# Patient Record
Sex: Female | Born: 1965 | Race: White | Hispanic: No | Marital: Married | Smoking: Never smoker
Health system: Southern US, Community
[De-identification: ages and names within clinical notes are randomized; demographics above are authoritative.]

## PROBLEM LIST (undated history)

## (undated) DIAGNOSIS — I10 Essential (primary) hypertension: Secondary | ICD-10-CM

## (undated) HISTORY — PX: ABDOMINAL HYSTERECTOMY: SHX81

## (undated) HISTORY — PX: CHOLECYSTECTOMY: SHX55

## (undated) HISTORY — DX: Essential (primary) hypertension: I10

## (undated) HISTORY — PX: BREAST REDUCTION SURGERY: SHX8

---

## 1997-09-02 ENCOUNTER — Other Ambulatory Visit: Admission: RE | Admit: 1997-09-02 | Discharge: 1997-09-02 | Payer: Self-pay | Admitting: Gynecology

## 2005-05-30 ENCOUNTER — Ambulatory Visit (HOSPITAL_BASED_OUTPATIENT_CLINIC_OR_DEPARTMENT_OTHER): Admission: RE | Admit: 2005-05-30 | Discharge: 2005-05-31 | Payer: Self-pay | Admitting: Specialist

## 2012-10-29 ENCOUNTER — Other Ambulatory Visit: Payer: Self-pay | Admitting: Gynecology

## 2014-03-13 HISTORY — PX: COLONOSCOPY: SHX174

## 2016-12-06 ENCOUNTER — Encounter: Payer: Self-pay | Admitting: Cardiology

## 2016-12-06 ENCOUNTER — Ambulatory Visit (INDEPENDENT_AMBULATORY_CARE_PROVIDER_SITE_OTHER): Payer: Commercial Managed Care - PPO | Admitting: Cardiology

## 2016-12-06 DIAGNOSIS — R9431 Abnormal electrocardiogram [ECG] [EKG]: Secondary | ICD-10-CM

## 2016-12-06 DIAGNOSIS — R0609 Other forms of dyspnea: Secondary | ICD-10-CM | POA: Diagnosis not present

## 2016-12-06 DIAGNOSIS — I10 Essential (primary) hypertension: Secondary | ICD-10-CM | POA: Diagnosis not present

## 2016-12-06 DIAGNOSIS — R002 Palpitations: Secondary | ICD-10-CM | POA: Diagnosis not present

## 2016-12-06 NOTE — Progress Notes (Signed)
Cardiology Consultation:    Date:  12/06/2016   ID:  Allison Owens, DOB 06/08/1965, MRN [id number]  PCP:  Allison Owens, Allison Popoca B, MD  Cardiologist:  Allison Balsamobert Demika Langenderfer, MD   Referring MD: Allison Owens, Allison Kloepfer B, MD   Chief Complaint  Patient presents with  . T wave inversion on EKG  . History of Tachycardia  I have abnormal EKG  History of Present Illness:    Allison Owens is a 51 y.o. female who is being seen today for the evaluation of abnormal EKG at the request of Allison Owens, [place] Virrueta B, MD.  She comes to me with a history of abnormal EKG.  Apparently when she went to her primary care physician to talk about potentially taking medication to help her lose weight.  Echocardiogram was done and some findings were noted.  She call us and get appointment to talk about that.  Overall she seems to be doing quite well she goes to the gym on a regular basis about 3-4 times a week she goes on the treadmill for about half an hour she can go at speed of about 4 mph and 2% of incline.  She gets short of breath and fatigue during the time but generally does not have any other complaint.  About a year ago she had situation that she was having some palpitations she described to have some skipped beats as well as some what appears to be more sustained arrhythmia lasting for about a month related to exercise self terminating there is no chest pain tightness squeezing pressure burning in the chest associated with this sensation.  He does have apparently good cholesterol but I will call primary care physician to get a copy of the report.  She never smoked.    Past Medical History:  Diagnosis Date  . Hypertension     Past Surgical History:  Procedure Laterality Date  . ABDOMINAL HYSTERECTOMY    . BREAST REDUCTION SURGERY    . CHOLECYSTECTOMY      Current Medications: Current Meds  Medication Sig  . propranolol ER (INDERAL LA) 160 MG SR capsule Take 1 capsule by mouth daily.  . valsartan-hydrochlorothiazide  (DIOVAN-HCT) 80-12.5 MG tablet Take 1 tablet by mouth daily. as directed     Allergies:   Patient has no known allergies.   Social History   Social History  . Marital status: Married    Spouse name: N/A  . Number of children: N/A  . Years of education: N/A   Social History Main Topics  . Smoking status: Never Smoker  . Smokeless tobacco: Never Used  . Alcohol use No  . Drug use: No  . Sexual activity: Not Asked   Other Topics Concern  . None   Social History Narrative  . None     Family History: The patient's family history includes Bladder Cancer in her father; Breast cancer in her mother; Kidney cancer in her father. ROS:   Please see the history of present illness.    All 14 point review of systems negative except as described per history of present illness.  EKGs/Labs/Other Studies Reviewed:    The following studies were reviewed today: EKG from her primary care office showing normal sinus rhythm, normal P interval, nonspecific ST-T segment changes with asymmetrical T inversion in inferior leads as well as biphasic T wave in V3 V4.  EKG:  EKG is  ordered today.  The ekg ordered today demonstrates normal sinus rhythm normal PR interval inverted T wave  in inferior leads is upright now.  There is some inverted T waves in lead V3  Recent Labs: No results found for requested labs within last 8760 hours.  Recent Lipid Panel No results found for: CHOL, TRIG, HDL, CHOLHDL, VLDL, LDLCALC, LDLDIRECT  Physical Exam:    VS:  BP 124/72   Pulse 68   Resp 10   Ht 5' 3.5" (1.613 m)   Wt 156 lb (70.8 kg)   BMI 27.20 kg/m     Wt Readings from Last 3 Encounters:  12/06/16 156 lb (70.8 kg)     GEN:  Well nourished, well developed in no acute distress HEENT: Normal NECK: No JVD; No carotid bruits LYMPHATICS: No lymphadenopathy CARDIAC: RRR, no murmurs, no rubs, no gallops RESPIRATORY:  Clear to auscultation without rales, wheezing or rhonchi  ABDOMEN: Soft, non-tender,  non-distended MUSCULOSKELETAL:  No edema; No deformity  SKIN: Warm and dry NEUROLOGIC:  Alert and oriented x 3 PSYCHIATRIC:  Normal affect   ASSESSMENT:    1. Abnormal EKG   2. Essential hypertension   3. Palpitations   4. Dyspnea on exertion    PLAN:    In order of problems listed above:  Abnormal EKG: Repeated EKG done today in the office looks better.  I will ask her to have echocardiogram to make sure structurally her heart is normal.  She will be also scheduled to have a stress echocardiogram to make sure there is no inducible ischemia.  I asked her to start taking one baby aspirin every single day until we do stress testing. Essential hypertension: Blood pressure well controlled continue present management. Palpitations: The rare we will watch this for now in the future she may require a monitor. Dyspnea on exertion: We will do echocardiogram and stress test to assess LV and potential ischemia.  Overall I have not rather low level of suspicion significant heart problems however echocardiogram and stress test need to be done.  In the future palpitations may need to be investigated more thoroughly.  Medication Adjustments/Labs and Tests Ordered: Current medicines are reviewed at length with the patient today.  Concerns regarding medicines are outlined above.  No orders of the defined types were placed in this encounter.  No orders of the defined types were placed in this encounter.   Signed, Georgeanna Lea, MD, [facility]. 12/06/2016 9:38 AM    [facility]

## 2016-12-06 NOTE — Patient Instructions (Addendum)
Medication Instructions:  Your physician recommends that you continue on your current medications as directed. Please refer to the Current Medication list given to you today.  1. Avoid all over-the-counter antihistamines except Claritin/Loratadine and Zyrtec/Cetrizine. 2. Avoid all combination including cold sinus allergies flu decongestant and sleep medications 3. You can use Robitussin DM Mucinex and Mucinex DM for cough. 4. can use Tylenol aspirin ibuprofen and naproxen but no combinations such as sleep or sinus.  Labwork: None   Testing/Procedures: EKG today in office.   Your physician has requested that you have an echocardiogram. Echocardiography is a painless test that uses sound waves to create images of your heart. It provides your doctor with information about the size and shape of your heart and how well your heart's chambers and valves are working. This procedure takes approximately one hour. There are no restrictions for this procedure.  Your physician has requested that you have a stress echocardiogram. For further information please visit [email or web address]. Please follow instruction sheet as given.  Stress [facility] Directions for [facility]: 1.) Please check in at the outpatient center at [facility] the day of your testing. 2.) Nothing to eat or drink after midnight prior to testing. Please do not take your medications that morning  3.) Please be aware that the test can take up to 3-4 hours.  4.) Should you have any problem with the appointment date or time, please call [phone].  (Please note that an IV may be required for some test.)  Follow-Up: Your physician recommends that you schedule a follow-up appointment in: 1 month   Any Other Special Instructions Will Be Listed Below (If Applicable).  Please note that any paperwork needing to be filled out by the provider will need to be addressed at the front desk prior to seeing the provider. Please note that  any paperwork FMLA, Disability or other documents regarding health condition is subject to a $25.00 charge that must be received prior to completion of paperwork in the form of a money order or check.    If you need a refill on your cardiac medications before your next appointment, please call your pharmacy.

## 2016-12-15 DIAGNOSIS — R0602 Shortness of breath: Secondary | ICD-10-CM | POA: Diagnosis not present

## 2016-12-15 DIAGNOSIS — R9431 Abnormal electrocardiogram [ECG] [EKG]: Secondary | ICD-10-CM | POA: Diagnosis not present

## 2016-12-16 ENCOUNTER — Telehealth: Payer: Self-pay

## 2016-12-16 ENCOUNTER — Other Ambulatory Visit: Payer: Self-pay

## 2016-12-16 DIAGNOSIS — R0609 Other forms of dyspnea: Secondary | ICD-10-CM

## 2016-12-16 DIAGNOSIS — R002 Palpitations: Secondary | ICD-10-CM

## 2016-12-16 DIAGNOSIS — R9431 Abnormal electrocardiogram [ECG] [EKG]: Secondary | ICD-10-CM

## 2016-12-16 NOTE — Telephone Encounter (Signed)
Left message regarding results for pt of stress echo and echo.

## 2018-03-20 ENCOUNTER — Other Ambulatory Visit: Payer: Self-pay | Admitting: Obstetrics and Gynecology

## 2018-03-20 DIAGNOSIS — Z9189 Other specified personal risk factors, not elsewhere classified: Secondary | ICD-10-CM

## 2018-04-09 ENCOUNTER — Other Ambulatory Visit: Payer: Commercial Managed Care - PPO

## 2018-08-13 ENCOUNTER — Emergency Department (HOSPITAL_COMMUNITY): Payer: Commercial Managed Care - PPO

## 2018-08-13 ENCOUNTER — Emergency Department (HOSPITAL_COMMUNITY)
Admission: EM | Admit: 2018-08-13 | Discharge: 2018-08-13 | Disposition: A | Discharge status: Home / Self Care | Payer: Commercial Managed Care - PPO | Attending: Emergency Medicine | Admitting: Emergency Medicine

## 2018-08-13 ENCOUNTER — Other Ambulatory Visit: Payer: Self-pay

## 2018-08-13 ENCOUNTER — Encounter: Payer: Self-pay | Admitting: General Surgery

## 2018-08-13 DIAGNOSIS — R0602 Shortness of breath: Secondary | ICD-10-CM | POA: Diagnosis not present

## 2018-08-13 DIAGNOSIS — I10 Essential (primary) hypertension: Secondary | ICD-10-CM | POA: Insufficient documentation

## 2018-08-13 DIAGNOSIS — Z79899 Other long term (current) drug therapy: Secondary | ICD-10-CM | POA: Insufficient documentation

## 2018-08-13 DIAGNOSIS — R05 Cough: Secondary | ICD-10-CM | POA: Diagnosis not present

## 2018-08-13 DIAGNOSIS — Z9889 Other specified postprocedural states: Secondary | ICD-10-CM | POA: Insufficient documentation

## 2018-08-13 DIAGNOSIS — R112 Nausea with vomiting, unspecified: Secondary | ICD-10-CM | POA: Insufficient documentation

## 2018-08-13 DIAGNOSIS — Z20828 Contact with and (suspected) exposure to other viral communicable diseases: Secondary | ICD-10-CM | POA: Insufficient documentation

## 2018-08-13 DIAGNOSIS — R509 Fever, unspecified: Secondary | ICD-10-CM | POA: Insufficient documentation

## 2018-08-13 LAB — URINALYSIS, ROUTINE W REFLEX MICROSCOPIC
Bacteria, UA: NONE SEEN
Bilirubin Urine: NEGATIVE
Glucose, UA: NEGATIVE mg/dL
Ketones, ur: NEGATIVE mg/dL
Leukocytes,Ua: NEGATIVE
Nitrite: NEGATIVE
Protein, ur: NEGATIVE mg/dL
Specific Gravity, Urine: 1.01 (ref 1.005–1.030)
pH: 5 (ref 5.0–8.0)

## 2018-08-13 LAB — COMPREHENSIVE METABOLIC PANEL
ALT: 12 U/L (ref 0–44)
AST: 22 U/L (ref 15–41)
Albumin: 2.9 g/dL — ABNORMAL LOW (ref 3.5–5.0)
Alkaline Phosphatase: 43 U/L (ref 38–126)
Anion gap: 8 (ref 5–15)
BUN: 10 mg/dL (ref 6–20)
CO2: 24 mmol/L (ref 22–32)
Calcium: 8.5 mg/dL — ABNORMAL LOW (ref 8.9–10.3)
Chloride: 103 mmol/L (ref 98–111)
Creatinine, Ser: 1.02 mg/dL — ABNORMAL HIGH (ref 0.44–1.00)
GFR calc Af Amer: >60 mL/min (ref >60)
GFR calc non Af Amer: >60 mL/min (ref >60)
Glucose, Bld: 104 mg/dL — ABNORMAL HIGH (ref 70–99)
Potassium: 3.9 mmol/L (ref 3.5–5.1)
Sodium: 135 mmol/L (ref 135–145)
Total Bilirubin: 1 mg/dL (ref 0.3–1.2)
Total Protein: 6.4 g/dL — ABNORMAL LOW (ref 6.5–8.1)

## 2018-08-13 LAB — CBC WITH DIFFERENTIAL/PLATELET
Abs Immature Granulocytes: 0.03 10*3/uL (ref 0.00–0.07)
Basophils Absolute: 0.1 10*3/uL (ref 0.0–0.1)
Basophils Relative: 1 %
Eosinophils Absolute: 0.6 10*3/uL — ABNORMAL HIGH (ref 0.0–0.5)
Eosinophils Relative: 6 %
HCT: 33.8 % — ABNORMAL LOW (ref 36.0–46.0)
Hemoglobin: 11.1 g/dL — ABNORMAL LOW (ref 12.0–15.0)
Immature Granulocytes: 0 %
Lymphocytes Relative: 16 %
Lymphs Abs: 1.6 10*3/uL (ref 0.7–4.0)
MCH: 31 pg (ref 26.0–34.0)
MCHC: 32.8 g/dL (ref 30.0–36.0)
MCV: 94.4 fL (ref 80.0–100.0)
Monocytes Absolute: 0.8 10*3/uL (ref 0.1–1.0)
Monocytes Relative: 8 %
Neutro Abs: 6.9 10*3/uL (ref 1.7–7.7)
Neutrophils Relative %: 69 %
Platelets: 288 10*3/uL (ref 150–400)
RBC: 3.58 MIL/uL — ABNORMAL LOW (ref 3.87–5.11)
RDW: 13.4 % (ref 11.5–15.5)
WBC: 10 10*3/uL (ref 4.0–10.5)
nRBC: 0 % (ref 0.0–0.2)

## 2018-08-13 LAB — LACTIC ACID, PLASMA: Lactic Acid, Venous: 1.2 mmol/L (ref 0.5–1.9)

## 2018-08-13 LAB — SARS CORONAVIRUS 2 BY RT PCR (HOSPITAL ORDER, PERFORMED IN ~~LOC~~ HOSPITAL LAB): SARS Coronavirus 2: NEGATIVE

## 2018-08-13 MED ORDER — SODIUM CHLORIDE 0.9 % IV SOLN
1000.0000 mL | INTRAVENOUS | Status: DC
  Administered 2018-08-13: 1000 mL via INTRAVENOUS

## 2018-08-13 MED ORDER — VANCOMYCIN HCL IN DEXTROSE 1-5 GM/200ML-% IV SOLN: 1000.0000 mg | Freq: Once | INTRAVENOUS | Status: DC

## 2018-08-13 MED ORDER — ACETAMINOPHEN 325 MG PO TABS
650.0000 mg | ORAL_TABLET | Freq: Once | ORAL | Status: AC
  Administered 2018-08-13: 18:00:00 650 mg via ORAL
  Filled 2018-08-13: qty 2

## 2018-08-13 MED ORDER — SODIUM CHLORIDE 0.9 % IV BOLUS
1000.0000 mL | Freq: Once | INTRAVENOUS | Status: AC
  Administered 2018-08-13: 1000 mL via INTRAVENOUS

## 2018-08-13 MED ORDER — IOHEXOL 350 MG/ML SOLN
100.0000 mL | Freq: Once | INTRAVENOUS | Status: AC | PRN
  Administered 2018-08-13: 14:00:00 100 mL via INTRAVENOUS

## 2018-08-13 MED ORDER — ONDANSETRON HCL 4 MG/2ML IJ SOLN
4.0000 mg | Freq: Once | INTRAMUSCULAR | Status: AC
  Administered 2018-08-13: 4 mg via INTRAVENOUS
  Filled 2018-08-13: qty 2

## 2018-08-13 MED ORDER — SODIUM CHLORIDE 0.9 % IV SOLN: 2.0000 g | Freq: Once | INTRAVENOUS | Status: DC

## 2018-08-13 MED ORDER — SODIUM CHLORIDE 0.9 % IV BOLUS (SEPSIS)
1000.0000 mL | Freq: Once | INTRAVENOUS | Status: AC
  Administered 2018-08-13: 1000 mL via INTRAVENOUS

## 2018-08-13 NOTE — Discharge Instructions (Addendum)
You have been seen today for fever. Please read and follow all provided instructions. Return to the emergency room for worsening condition or new concerning symptoms.    1. Medications:  Continue the antibiotic you are prescribed by Dr. Lenon Curt.  Please take as prescribed. You can continue taking Tylenol as needed for fever. Please take as directed. Continue usual home medications Take medications as prescribed. Please review all of the medicines and only take them if you do not have an allergy to them.   2. Treatment: rest, drink plenty of fluids 3. Follow Up: Please follow up with Dr. Lenon Curt tomorrow at your scheduled appointment.  It is also a possibility that you have an allergic reaction to any of the medicines that you have been prescribed - Everybody reacts differently to medications and while MOST people have no trouble with most medicines, you may have a reaction such as nausea, vomiting, rash, swelling, shortness of breath. If this is the case, please stop taking the medicine immediately and contact your physician.  ?

## 2018-08-13 NOTE — ED Provider Notes (Signed)
[facility]   CSN: [id number] Arrival date & time: 08/13/18  1134    History   Chief Complaint Chief Complaint  Patient presents with   Fever    HPI Allison Owens is a 53 y.o. female has medical history of hypertension presents emergency department today with chief complaint of fever x3 days.  Patient states 10 days ago she had abdominoplasty with Dr. Izora Ribasoley.  Her postop has been uneventful, until this weekend when she developed fever.  T-max of 102.  She has been taking Tylenol every 5 hours.  Last dose was 1 hour prior to arrival.  Patient states she also has shortness of breath and non productive cough.  Also reports nausea and 2 episodes of nonbloody and nonbilious emesis in the last 24 hours. Pt called Dr. Debby Budoley's office x 2 days ago and was prescribed Keflex for possible infection. She followed up in office today and was sent to the ED for further evaluation. Denies headache, neck pain, chest pain, exogenous estrogen use, lower extremity pain or swelling, history of PE or DVT, family or personal history of bleeding or clotting disorders, or hemoptysis.  Also denies any sick contacts, any exposure to someone positive with COVID-19.  History provided by patient with additional history obtained from chart review.      Past Medical History:  Diagnosis Date   Hypertension     Patient Active Problem List   Diagnosis Date Noted   Abnormal EKG 12/06/2016   Essential hypertension 12/06/2016   Palpitations 12/06/2016   Dyspnea on exertion 12/06/2016    Past Surgical History:  Procedure Laterality Date   ABDOMINAL HYSTERECTOMY     BREAST REDUCTION SURGERY     CHOLECYSTECTOMY       OB History   No obstetric history on file.      Home Medications    Prior to Admission medications   Medication Sig Start Date End Date Taking? Authorizing Provider  propranolol ER (INDERAL LA) 160 MG SR capsule Take 1 capsule by  mouth daily. 03/10/15   [provider]  valsartan-hydrochlorothiazide (DIOVAN-HCT) 80-12.5 MG tablet Take 1 tablet by mouth daily. as directed 11/02/16   [provider]    Family History Family History  Problem Relation Age of Onset   Breast cancer Mother    Bladder Cancer Father    Kidney cancer Father     Social History Social History   Tobacco Use   Smoking status: Never Smoker   Smokeless tobacco: Never Used  Substance Use Topics   Alcohol use: No   Drug use: No     Allergies   Patient has no known allergies.   Review of Systems Review of Systems  Constitutional: Positive for fever. Negative for chills.  HENT: Negative for congestion, ear discharge, ear pain, sinus pressure, sinus pain and sore throat.   Eyes: Negative for pain and redness.  Respiratory: Positive for cough and shortness of breath.   Cardiovascular: Negative for chest pain, palpitations and leg swelling.  Gastrointestinal: Positive for nausea and vomiting. Negative for abdominal pain, constipation and diarrhea.  Genitourinary: Negative for dysuria and hematuria.  Musculoskeletal: Negative for back pain and neck pain.  Skin: Negative for wound.  Neurological: Negative for weakness, numbness and headaches.     Physical Exam Updated Vital Signs BP 105/69    Pulse 86    Temp 99.3 F (37.4 C) (Oral) Comment: took tylenol 11am today   Resp 16  Ht 5\' 3"  (1.6 m)    Wt 68.9 kg    SpO2 97%    BMI 26.93 kg/m   Physical Exam Vitals signs and nursing note reviewed.  Constitutional:      General: She is not in acute distress.    Appearance: She is not ill-appearing.  HENT:     Head: Normocephalic and atraumatic.     Right Ear: Tympanic membrane and external ear normal.     Left Ear: Tympanic membrane and external ear normal.     Nose: Nose normal.     Mouth/Throat:     Mouth: Mucous membranes are moist.     Pharynx: Oropharynx is clear.  Eyes:     General: No scleral  icterus.       Right eye: No discharge.        Left eye: No discharge.     Extraocular Movements: Extraocular movements intact.     Conjunctiva/sclera: Conjunctivae normal.     Pupils: Pupils are equal, round, and reactive to light.  Neck:     Musculoskeletal: Normal range of motion.     Vascular: No JVD.  Cardiovascular:     Rate and Rhythm: Normal rate and regular rhythm.     Pulses: Normal pulses.          Radial pulses are 2+ on the right side and 2+ on the left side.     Heart sounds: Normal heart sounds.  Pulmonary:     Comments: Lungs clear to auscultation in all fields. Symmetric chest rise. No wheezing, rales, or rhonchi. Chest:     Chest wall: No tenderness.  Abdominal:       Comments: Abdomen is soft, non-distended. Pt with abdominal incision. No drainage or surrounding erythema noted, no dehiscence. Appropriately tender to palpation over incision.  Musculoskeletal: Normal range of motion.     Comments: Homans sign absent bilaterally, no lower extremity edema, no palpable cords, compartments are soft.  Skin:    General: Skin is warm and dry.     Capillary Refill: Capillary refill takes less than 2 seconds.  Neurological:     Mental Status: She is oriented to person, place, and time.     GCS: GCS eye subscore is 4. GCS verbal subscore is 5. GCS motor subscore is 6.     Comments: Fluent speech, no facial droop.  Psychiatric:        Behavior: Behavior normal.      ED Treatments / Results  Labs (all labs ordered are listed, but only abnormal results are displayed) Labs Reviewed  COMPREHENSIVE METABOLIC PANEL - Abnormal; Notable for the following components:      Result Value   Glucose, Bld 104 (*)    Creatinine, Ser 1.02 (*)    Calcium 8.5 (*)    Total Protein 6.4 (*)    Albumin 2.9 (*)    All other components within normal limits  CBC WITH DIFFERENTIAL/PLATELET - Abnormal; Notable for the following components:   RBC 3.58 (*)    Hemoglobin 11.1 (*)    HCT  33.8 (*)    Eosinophils Absolute 0.6 (*)    All other components within normal limits  URINALYSIS, ROUTINE W REFLEX MICROSCOPIC - Abnormal; Notable for the following components:   APPearance HAZY (*)    Hgb urine dipstick SMALL (*)    All other components within normal limits  SARS CORONAVIRUS 2 (HOSPITAL ORDER, [facility])  CULTURE, BLOOD (ROUTINE X 2)  CULTURE, BLOOD (ROUTINE X 2)  GASTROINTESTINAL PANEL BY PCR, STOOL (REPLACES STOOL CULTURE)  LACTIC ACID, PLASMA    EKG None  Radiology Ct Angio Chest Pe W And/or Wo Contrast  Result Date: 08/13/2018 CLINICAL DATA:  53 year old female with shortness of breath. Recent tummy tuck surgery. Patient also has fever and chills and cough and vomiting. EXAM: CT ANGIOGRAPHY CHEST CT ABDOMEN AND PELVIS WITH CONTRAST TECHNIQUE: Multidetector CT imaging of the chest was performed using the standard protocol during bolus administration of intravenous contrast. Multiplanar CT image reconstructions and MIPs were obtained to evaluate the vascular anatomy. Multidetector CT imaging of the abdomen and pelvis was performed using the standard protocol during bolus administration of intravenous contrast. CONTRAST:  100mL OMNIPAQUE IOHEXOL 350 MG/ML SOLN COMPARISON:  CT of the abdomen pelvis dated 11/19/2009 FINDINGS: CTA CHEST FINDINGS Cardiovascular: There is no cardiomegaly or pericardial effusion. The thoracic aorta is unremarkable. The origins of the great vessels of the aortic arch appear patent as visualized. There is no CT evidence of pulmonary embolism. Mediastinum/Nodes: No hilar or mediastinal adenopathy. The esophagus and the thyroid gland are grossly unremarkable. No mediastinal fluid collection. Lungs/Pleura: The lungs are clear. There is no pleural effusion or pneumothorax. The central airways are patent. Musculoskeletal: No chest wall abnormality. No acute or significant osseous findings. Review of the MIP images confirms the  above findings. CT ABDOMEN and PELVIS FINDINGS No intra-abdominal free air or free fluid. Hepatobiliary: Cholecystectomy. Probable mild fatty infiltration of the liver. No intrahepatic biliary ductal dilatation. Pancreas: Unremarkable. No pancreatic ductal dilatation or surrounding inflammatory changes. Spleen: Normal in size without focal abnormality. Adrenals/Urinary Tract: Adrenal glands are unremarkable. Kidneys are normal, without renal calculi, focal lesion, or hydronephrosis. Bladder is unremarkable. Stomach/Bowel: There is no bowel obstruction or active inflammation. Small scattered sigmoid diverticula without active inflammation. The appendix is normal. Vascular/Lymphatic: The abdominal aorta and IVC are unremarkable. No portal venous gas. There is no adenopathy. Reproductive: Hysterectomy. No pelvic mass. Other: Diffuse subcutaneous edema of the lower abdomen and pelvis in keeping with recent history of liposuction. There is a 10.4 x 2.2 x 7.8 cm fluid collection with small pockets of air superficial to the rectus muscle in the subcutaneous fat extending from the level of the umbilicus inferiorly. This may represent postsurgical fluid collection or seroma although superimposed infection is not entirely excluded. Clinical correlation is recommended. Musculoskeletal: Degenerative changes of the lower lumbar spine. No acute osseous pathology. Review of the MIP images confirms the above findings. IMPRESSION: 1. No acute intrathoracic pathology. No CT evidence of pulmonary embolism or aortic dissection. 2. Postsurgical changes of liposuction. A fluid collection with small pockets of air superficial to the rectus muscle in the subcutaneous fat in the lower abdomen/pelvis may represent postsurgical fluid collection or seroma or possibly an infected fluid. Clinical correlation is recommended. Electronically Signed   By: Elgie CollardArash  Radparvar M.D.   On: 08/13/2018 14:48   Ct Abdomen Pelvis W Contrast  Result Date:  08/13/2018 CLINICAL DATA:  53 year old female with shortness of breath. Recent tummy tuck surgery. Patient also has fever and chills and cough and vomiting. EXAM: CT ANGIOGRAPHY CHEST CT ABDOMEN AND PELVIS WITH CONTRAST TECHNIQUE: Multidetector CT imaging of the chest was performed using the standard protocol during bolus administration of intravenous contrast. Multiplanar CT image reconstructions and MIPs were obtained to evaluate the vascular anatomy. Multidetector CT imaging of the abdomen and pelvis was performed using the standard protocol during bolus administration of intravenous contrast. CONTRAST:  100mL OMNIPAQUE  IOHEXOL 350 MG/ML SOLN COMPARISON:  CT of the abdomen pelvis dated 11/19/2009 FINDINGS: CTA CHEST FINDINGS Cardiovascular: There is no cardiomegaly or pericardial effusion. The thoracic aorta is unremarkable. The origins of the great vessels of the aortic arch appear patent as visualized. There is no CT evidence of pulmonary embolism. Mediastinum/Nodes: No hilar or mediastinal adenopathy. The esophagus and the thyroid gland are grossly unremarkable. No mediastinal fluid collection. Lungs/Pleura: The lungs are clear. There is no pleural effusion or pneumothorax. The central airways are patent. Musculoskeletal: No chest wall abnormality. No acute or significant osseous findings. Review of the MIP images confirms the above findings. CT ABDOMEN and PELVIS FINDINGS No intra-abdominal free air or free fluid. Hepatobiliary: Cholecystectomy. Probable mild fatty infiltration of the liver. No intrahepatic biliary ductal dilatation. Pancreas: Unremarkable. No pancreatic ductal dilatation or surrounding inflammatory changes. Spleen: Normal in size without focal abnormality. Adrenals/Urinary Tract: Adrenal glands are unremarkable. Kidneys are normal, without renal calculi, focal lesion, or hydronephrosis. Bladder is unremarkable. Stomach/Bowel: There is no bowel obstruction or active inflammation. Small  scattered sigmoid diverticula without active inflammation. The appendix is normal. Vascular/Lymphatic: The abdominal aorta and IVC are unremarkable. No portal venous gas. There is no adenopathy. Reproductive: Hysterectomy. No pelvic mass. Other: Diffuse subcutaneous edema of the lower abdomen and pelvis in keeping with recent history of liposuction. There is a 10.4 x 2.2 x 7.8 cm fluid collection with small pockets of air superficial to the rectus muscle in the subcutaneous fat extending from the level of the umbilicus inferiorly. This may represent postsurgical fluid collection or seroma although superimposed infection is not entirely excluded. Clinical correlation is recommended. Musculoskeletal: Degenerative changes of the lower lumbar spine. No acute osseous pathology. Review of the MIP images confirms the above findings. IMPRESSION: 1. No acute intrathoracic pathology. No CT evidence of pulmonary embolism or aortic dissection. 2. Postsurgical changes of liposuction. A fluid collection with small pockets of air superficial to the rectus muscle in the subcutaneous fat in the lower abdomen/pelvis may represent postsurgical fluid collection or seroma or possibly an infected fluid. Clinical correlation is recommended. Electronically Signed   By: Elgie Collard M.D.   On: 08/13/2018 14:48    Procedures Procedures (including critical care time)  Medications Ordered in ED Medications  sodium chloride 0.9 % bolus 1,000 mL (0 mLs Intravenous Stopped 08/13/18 1440)  ondansetron (ZOFRAN) injection 4 mg (4 mg Intravenous Given 08/13/18 1254)  sodium chloride 0.9 % bolus 1,000 mL (0 mLs Intravenous Stopped 08/13/18 1512)  iohexol (OMNIPAQUE) 350 MG/ML injection 100 mL (100 mLs Intravenous Contrast Given 08/13/18 1416)  ondansetron (ZOFRAN) injection 4 mg (4 mg Intravenous Given 08/13/18 1552)  acetaminophen (TYLENOL) tablet 650 mg (650 mg Oral Given 08/13/18 1806)     Initial Impression / Assessment and Plan / ED  Course  I have reviewed the triage vital signs and the nursing notes.  Pertinent labs & imaging results that were available during my care of the patient were reviewed by me and considered in my medical decision making (see chart for details).  53 year old female presents with fever 10 days s/p abdominoplasty with Dr. Izora Ribas.  On arrival low-grade fever of 99.3, normotensive, not tachycardic, not hypoxic.  She is overall well-appearing.  Tenderness palpation of abdomen over surgical incision.  Incision is clean dry and intact no obvious signs of infection.  Negative Homans sign bilaterally.  Work-up initiated with CBC, CMP, lactic acid, UA, blood cultures.  DDX includes intraabdominal infection, PE, URI, UTI, pneumonia, covid. Plan  to obtain CTA chest and CT abdomen pelvis imaging for further evaluation.  IV fluids started and Zofran for nausea.  Will reassess.  CBC and CMP overall unremarkable. Lactic acid within normal range. Blood cultures pending. CTA chest negative for PE. CT A/P shows 10.4 x 2.2 x 7.8 cm fluid collection with small pockets of air superficial to the rectus muscle in the subcutaneous fat. Radiologist reports possible postsurgical fluid collection or seroma.  CT findings discussed with Dr. Izora Ribasoley.  He is recommending continuing p.o. antibiotics and discharge home to follow-up in office tomorrow for possible drainage of abdominal fluid collection. Recheck temperature 100.9, tylenol given. Pt is tolerating PO intake. I ambulated pt to restroom multiple times an she did so without difficulty, steady gait, no dyspnea. Covid test is negative.  Patient is hemodynamically stable, in NAD, and able to ambulate in the ED. Evaluation does not show pathology that would require ongoing emergent intervention or inpatient treatment. I explained the diagnosis to the patient.  Patient and her daughter are comfortable with above plan and pt is stable for discharge at this time. All questions were  answered prior to disposition.  Strict return precautions for returning to the ED were discussed. Encouraged follow up with Dr. Izora Ribasoley with scheduled appointment tomorrow at 1pm. Findings and plan of care discussed with supervising physician Dr. Broadus JohnPfieffer.   This note was prepared using Dragon voice recognition software and may include unintentional dictation errors due to the inherent limitations of voice recognition software.     Final Clinical Impressions(s) / ED Diagnoses   Final diagnoses:  Fever, unspecified fever cause    ED Discharge Orders    None       Sherene Sireslbrizze, Terryl Niziolek E, PA-C 08/14/18 0128    Arby BarrettePfeiffer, Marcy, MD 08/14/18 1123

## 2018-08-13 NOTE — ED Notes (Signed)
Patient verbalizes understanding of discharge instructions. Opportunity for questioning and answers were provided. Armband removed by staff, pt discharged from ED.  

## 2018-08-13 NOTE — ED Triage Notes (Signed)
Patient post op 10 days ago, got drain pulled Friday and started having fever 102 at home, chills, slight dry cough, vomiting and dry heaves.

## 2018-08-18 LAB — CULTURE, BLOOD (ROUTINE X 2)
Culture: NO GROWTH
Culture: NO GROWTH
Special Requests: ADEQUATE

## 2019-11-04 ENCOUNTER — Other Ambulatory Visit: Payer: Self-pay

## 2019-11-04 ENCOUNTER — Encounter (HOSPITAL_BASED_OUTPATIENT_CLINIC_OR_DEPARTMENT_OTHER): Payer: Self-pay | Admitting: *Deleted

## 2019-11-04 ENCOUNTER — Emergency Department (HOSPITAL_BASED_OUTPATIENT_CLINIC_OR_DEPARTMENT_OTHER)
Admission: EM | Admit: 2019-11-04 | Discharge: 2019-11-04 | Disposition: A | Discharge status: Home / Self Care | Payer: Commercial Managed Care - PPO | Attending: Emergency Medicine | Admitting: Emergency Medicine

## 2019-11-04 DIAGNOSIS — Z5321 Procedure and treatment not carried out due to patient leaving prior to being seen by health care provider: Secondary | ICD-10-CM | POA: Insufficient documentation

## 2019-11-04 DIAGNOSIS — S91352A Open bite, left foot, initial encounter: Secondary | ICD-10-CM | POA: Diagnosis present

## 2019-11-04 DIAGNOSIS — W5911XA Bitten by nonvenomous snake, initial encounter: Secondary | ICD-10-CM | POA: Insufficient documentation

## 2019-11-04 NOTE — ED Triage Notes (Addendum)
Pt c/o left foot snake bite x 3 days ago seen at  Ed for same  , sent here by PMD for [place] d/t increase swelling

## 2019-11-04 NOTE — ED Notes (Signed)
Strong pulses to right foot by bedside doppler

## 2019-11-05 ENCOUNTER — Emergency Department (HOSPITAL_COMMUNITY)
Admission: EM | Admit: 2019-11-05 | Discharge: 2019-11-05 | Disposition: A | Discharge status: Home / Self Care | Payer: Commercial Managed Care - PPO | Attending: Emergency Medicine | Admitting: Emergency Medicine

## 2019-11-05 ENCOUNTER — Emergency Department (HOSPITAL_BASED_OUTPATIENT_CLINIC_OR_DEPARTMENT_OTHER): Payer: Commercial Managed Care - PPO

## 2019-11-05 ENCOUNTER — Encounter (HOSPITAL_COMMUNITY): Payer: Self-pay | Admitting: Emergency Medicine

## 2019-11-05 ENCOUNTER — Emergency Department (HOSPITAL_COMMUNITY): Payer: Commercial Managed Care - PPO

## 2019-11-05 ENCOUNTER — Other Ambulatory Visit: Payer: Self-pay

## 2019-11-05 DIAGNOSIS — Z2912 Encounter for prophylactic antivenin: Secondary | ICD-10-CM | POA: Insufficient documentation

## 2019-11-05 DIAGNOSIS — Z79899 Other long term (current) drug therapy: Secondary | ICD-10-CM | POA: Diagnosis not present

## 2019-11-05 DIAGNOSIS — I10 Essential (primary) hypertension: Secondary | ICD-10-CM | POA: Diagnosis not present

## 2019-11-05 DIAGNOSIS — M79609 Pain in unspecified limb: Secondary | ICD-10-CM

## 2019-11-05 DIAGNOSIS — M7989 Other specified soft tissue disorders: Secondary | ICD-10-CM

## 2019-11-05 DIAGNOSIS — W5911XD Bitten by nonvenomous snake, subsequent encounter: Secondary | ICD-10-CM

## 2019-11-05 DIAGNOSIS — T63001D Toxic effect of unspecified snake venom, accidental (unintentional), subsequent encounter: Secondary | ICD-10-CM | POA: Diagnosis present

## 2019-11-05 LAB — CBC
HCT: 40.8 % (ref 36.0–46.0)
Hemoglobin: 13.2 g/dL (ref 12.0–15.0)
MCH: 30.1 pg (ref 26.0–34.0)
MCHC: 32.4 g/dL (ref 30.0–36.0)
MCV: 92.9 fL (ref 80.0–100.0)
Platelets: 196 10*3/uL (ref 150–400)
RBC: 4.39 MIL/uL (ref 3.87–5.11)
RDW: 13 % (ref 11.5–15.5)
WBC: 6.9 10*3/uL (ref 4.0–10.5)
nRBC: 0 % (ref 0.0–0.2)

## 2019-11-05 LAB — PROTIME-INR
INR: 1.1 (ref 0.8–1.2)
Prothrombin Time: 13.5 seconds (ref 11.4–15.2)

## 2019-11-05 LAB — COMPREHENSIVE METABOLIC PANEL
ALT: 26 U/L (ref 0–44)
AST: 33 U/L (ref 15–41)
Albumin: 3.5 g/dL (ref 3.5–5.0)
Alkaline Phosphatase: 54 U/L (ref 38–126)
Anion gap: 9 (ref 5–15)
BUN: 13 mg/dL (ref 6–20)
CO2: 25 mmol/L (ref 22–32)
Calcium: 8.9 mg/dL (ref 8.9–10.3)
Chloride: 104 mmol/L (ref 98–111)
Creatinine, Ser: 0.98 mg/dL (ref 0.44–1.00)
GFR calc Af Amer: >60 mL/min (ref >60)
GFR calc non Af Amer: >60 mL/min (ref >60)
Glucose, Bld: 100 mg/dL — ABNORMAL HIGH (ref 70–99)
Potassium: 3.5 mmol/L (ref 3.5–5.1)
Sodium: 138 mmol/L (ref 135–145)
Total Bilirubin: 1 mg/dL (ref 0.3–1.2)
Total Protein: 6.9 g/dL (ref 6.5–8.1)

## 2019-11-05 LAB — I-STAT BETA HCG BLOOD, ED (MC, WL, AP ONLY): I-stat hCG, quantitative: <5 m[IU]/mL (ref <5)

## 2019-11-05 LAB — FIBRINOGEN: Fibrinogen: 457 mg/dL (ref 210–475)

## 2019-11-05 LAB — APTT: aPTT: 31 seconds (ref 24–36)

## 2019-11-05 MED ORDER — CROTALIDAE POLYVAL IMMUNE FAB IV SOLR: 4.0000 | Freq: Once | INTRAVENOUS | Status: DC

## 2019-11-05 MED ORDER — SODIUM CHLORIDE 0.9 % IV SOLN
4.0000 | Freq: Once | INTRAVENOUS | Status: AC
  Administered 2019-11-05: 4 via INTRAVENOUS
  Filled 2019-11-05: qty 72

## 2019-11-05 NOTE — ED Triage Notes (Signed)
Patient states she was bit by a copperhead on her L foot Saturday night, pt went to [facility], was not given crofab, evaluated for a few hours then sent home. States she saw PCP yesterday who was concerned due to continued swelling and pain. Pt sent to [facility] for [place] yesterday but states when she got there they said they could not do anything for her so she left. L foot swollen, and pt states that swelling has spread up her leg. Foot is grey, unable to palpate pedal pulse.

## 2019-11-05 NOTE — ED Provider Notes (Signed)
[facility] EMERGENCY DEPARTMENT Provider Note   CSN: [id number] Arrival date & time: 11/05/19  6237     History Chief Complaint  Patient presents with  . Snake Bite    Allison Owens is a 54 y.o. female.  HPI  54 year old woman with snakebite to left foot that occurred 4 days prior to evaluation.  She was seen initially at an outlying hospital.  At that time, she did not meet CroFab criteria.  She has had ongoing pain and increased swelling in the foot.  She was seen by her primary care doctor yesterday.  She was advised to come for further evaluation.  She has noted swelling up to the groin.  She has some redness of the foot up onto the lower leg.  She has not noted any dyspnea, lightheadedness, other bleeding, nausea, or vomiting.     Past Medical History:  Diagnosis Date  . Hypertension     Patient Active Problem List   Diagnosis Date Noted  . Abnormal EKG 12/06/2016  . Essential hypertension 12/06/2016  . Palpitations 12/06/2016  . Dyspnea on exertion 12/06/2016    Past Surgical History:  Procedure Laterality Date  . ABDOMINAL HYSTERECTOMY    . BREAST REDUCTION SURGERY    . CHOLECYSTECTOMY       OB History   No obstetric history on file.     Family History  Problem Relation Age of Onset  . Breast cancer Mother   . Bladder Cancer Father   . Kidney cancer Father     Social History   Tobacco Use  . Smoking status: Never Smoker  . Smokeless tobacco: Never Used  Vaping Use  . Vaping Use: Never used  Substance Use Topics  . Alcohol use: No  . Drug use: No    Home Medications Prior to Admission medications   Medication Sig Start Date End Date Taking? Authorizing Provider  propranolol ER (INDERAL LA) 160 MG SR capsule Take 1 capsule by mouth daily. 03/10/15  Yes [provider]  rizatriptan (MAXALT) 10 MG tablet Take 10 mg by mouth as directed. 10 mg at the start of migraine   Yes [provider]    Allergies     Codeine and Versed [midazolam]  Review of Systems   Review of Systems  All other systems reviewed and are negative.   Physical Exam Updated Vital Signs BP (!) 141/83 (BP Location: Right Arm)   Pulse 74   Temp 98.1 F (36.7 C) (Oral)   Resp 17   Ht 1.6 m (5\' 3" )   Wt 70.8 kg   SpO2 97%   BMI 27.63 kg/m   Physical Exam Vitals and nursing note reviewed.  Constitutional:      Appearance: Normal appearance.  HENT:     Head: Normocephalic.     Right Ear: External ear normal.     Left Ear: External ear normal.     Nose: Nose normal.     Mouth/Throat:     Mouth: Mucous membranes are moist.  Eyes:     Pupils: Pupils are equal, round, and reactive to light.  Cardiovascular:     Rate and Rhythm: Normal rate and regular rhythm.     Pulses: Normal pulses.     Heart sounds: Normal heart sounds.  Pulmonary:     Effort: Pulmonary effort is normal.  Abdominal:     General: Bowel sounds are normal.     Palpations: Abdomen is soft.  Musculoskeletal:  Cervical back: Normal range of motion.     Comments: Left foot with 2 puncture wounds with surrounding erythema and discoloration of foot extending up to ankle.  There is swelling of the left lower extremity compared to the right lower extremity most pronounced in the lower part of the leg.  Pulses are intact.  Sensation is intact.  No cords are noted  Skin:    General: Skin is warm and dry.     Capillary Refill: Capillary refill takes less than 2 seconds.  Neurological:     General: No focal deficit present.     Mental Status: She is alert.  Psychiatric:        Mood and Affect: Mood normal.     ED Results / Procedures / Treatments   Labs (all labs ordered are listed, but only abnormal results are displayed) Labs Reviewed  CBC  PROTIME-INR  APTT  FIBRINOGEN  COMPREHENSIVE METABOLIC PANEL  I-STAT BETA HCG BLOOD, ED ([facility], [facility], [facility] ONLY)    EKG None  Radiology DG Foot 2 Views Left  Result Date: 11/05/2019 CLINICAL  DATA:  Snake bite to dorsal surface of LEFT foot on Saturday, swelling increased to mid calf, pain radiating to LEFT groin EXAM: LEFT FOOT - 2 VIEW COMPARISON:  None FINDINGS: Diffuse soft tissue swelling. Osseous mineralization normal. Joint spaces preserved. No fracture, dislocation, or bone destruction. IMPRESSION: Soft tissue swelling without osseous abnormalities. Electronically Signed   By: Ulyses Southward M.D.   On: 11/05/2019 09:57    Procedures Procedures (including critical care time)  Medications Ordered in ED Medications  crotalidae polyvalent immune fab (CROFAB) 4 vial in sodium chloride 0.9 % 250 mL infusion (has no administration in time range)    ED Course  I have reviewed the triage vital signs and the nursing notes.  Pertinent labs & imaging results that were available during my care of the patient were reviewed by me and considered in my medical decision making (see chart for details).    MDM Rules/Calculators/A&P                         Discussed tach frequency of CroFab in patient with bite 4 days ago.  There appears to be some efficacy and patient is offered CroFab therapy. Snake bite to foot 4 days ago. Labs checked and within normal limits including INR high, platelets, fibrinogen, PTT. No evidence of systemic reaction on exam Extremity is swollen does not appear to have any vascular insufficiency or signs consistent with compartment syndrome  Final Clinical Impression(s) / ED Diagnoses Final diagnoses:  Snake bite, subsequent encounter    Rx / DC Orders ED Discharge Orders    None       Margarita Grizzle, MD 11/08/19 1316

## 2019-11-05 NOTE — Discharge Instructions (Signed)
Use Ace wrap right knee so as not to cause any vascular insufficiency or pain Keep leg elevated Use pain medicine as prescribed Return for recheck if increasing swelling, pain, redness, or fever.

## 2019-11-05 NOTE — Progress Notes (Signed)
Left Lower Ext. study completed.   See CVProc for preliminary results.   Sydnee Lamour, RDMS, RVT 

## 2019-11-11 ENCOUNTER — Ambulatory Visit (INDEPENDENT_AMBULATORY_CARE_PROVIDER_SITE_OTHER): Payer: Commercial Managed Care - PPO | Admitting: Orthopaedic Surgery

## 2019-11-11 ENCOUNTER — Encounter: Payer: Self-pay | Admitting: Orthopaedic Surgery

## 2019-11-11 DIAGNOSIS — M79675 Pain in left toe(s): Secondary | ICD-10-CM | POA: Diagnosis not present

## 2019-11-11 DIAGNOSIS — M7989 Other specified soft tissue disorders: Secondary | ICD-10-CM | POA: Diagnosis not present

## 2019-11-11 DIAGNOSIS — W5911XD Bitten by nonvenomous snake, subsequent encounter: Secondary | ICD-10-CM | POA: Diagnosis not present

## 2019-11-11 MED ORDER — IBUPROFEN 800 MG PO TABS: 800.0000 mg | ORAL_TABLET | Freq: Three times a day (TID) | ORAL | 1 refills | Status: AC | PRN

## 2019-11-11 MED ORDER — GABAPENTIN 100 MG PO CAPS: 100.0000 mg | ORAL_CAPSULE | Freq: Three times a day (TID) | ORAL | 0 refills | Status: AC | PRN

## 2019-11-11 NOTE — Progress Notes (Signed)
Office Visit Note   Patient: Allison Owens           Date of Birth: [birth year]           MRN: [id number] Visit Date: 11/11/2019              Requested by: Heywood Bene, MD [address] [place],  [place] [address] PCP: Heywood Bene, MD   Assessment & Plan: Visit Diagnoses:  1. Pain and swelling of toe of left foot   2. Snake bite, subsequent encounter     Plan:   This should hopefully resolve with time.  I am going to keep her out of work at least this week and reevaluate her in 1 week.  She will work on keeping it elevated and can weight-bear as tolerated.  I will send in 100 mg ibuprofen.  I also recommended Benadryl as an antihistamine.  I will also try low-dose Neurontin for the burning type of pain she is having.  All questions and concerns were answered addressed.  Again we will reevaluate her in 1 week.  Follow-Up Instructions: Return in about 1 week (around 11/18/2019).   Orders:  No orders of the defined types were placed in this encounter.  Meds ordered this encounter  Medications  . ibuprofen (ADVIL) 800 MG tablet    Sig: Take 1 tablet (800 mg total) by mouth every 8 (eight) hours as needed.    Dispense:  60 tablet    Refill:  1  . gabapentin (NEURONTIN) 100 MG capsule    Sig: Take 1 capsule (100 mg total) by mouth 3 (three) times daily as needed.    Dispense:  60 capsule    Refill:  0      Procedures: No procedures performed   Clinical Data: No additional findings.   Subjective: Chief Complaint  Patient presents with  . Left Foot - Injury  The patient is a 54 year old female who is 9 days out from a snake bite from a copperhead to her left foot.  She presented to an outside emergency room and then presented to the [facility] emergency room about 4 days later.  They did give her antivenom at the time.  She said the foot was incredibly swollen and achy bruised and black and quite a bit.  They did rule out a DVT and x-rays only showed soft tissue  swelling.  She is now on crutches and reports a burning type of pain when her foot is hanging down.  She says some the swelling has improved.  She denies any other significant acute problems in her medical status.  She did have groin pain from swollen lymph nodes on the left side but that is subsiding.  Most of her pain is from the mid calf down to the foot.  HPI  Review of Systems She currently denies any headache, chest pain, shortness of breath, fever, chills, nausea, vomiting  Objective: Vital Signs: There were no vitals taken for this visit.  Physical Exam She is alert and orient x3 and in no acute distress Ortho Exam Examination of her left foot shows global swelling.  She is able to extend her toes and extend her ankle as well as flex it.  She reports normal sensation over the top of her foot.  Her foot seems to be well perfused.  Her calf is soft.  The lateral aspect of her leg is painful to palpation.  Her knee and hip exam on  that left side are entirely normal. Specialty Comments:  No specialty comments available.  Imaging: No results found.   PMFS History: Patient Active Problem List   Diagnosis Date Noted  . Abnormal EKG 12/06/2016  . Essential hypertension 12/06/2016  . Palpitations 12/06/2016  . Dyspnea on exertion 12/06/2016   Past Medical History:  Diagnosis Date  . Hypertension     Family History  Problem Relation Age of Onset  . Breast cancer Mother   . Bladder Cancer Father   . Kidney cancer Father     Past Surgical History:  Procedure Laterality Date  . ABDOMINAL HYSTERECTOMY    . BREAST REDUCTION SURGERY    . CHOLECYSTECTOMY     Social History   Occupational History  . Not on file  Tobacco Use  . Smoking status: Never Smoker  . Smokeless tobacco: Never Used  Vaping Use  . Vaping Use: Never used  Substance and Sexual Activity  . Alcohol use: No  . Drug use: No  . Sexual activity: Not on file

## 2019-11-18 ENCOUNTER — Encounter: Payer: Self-pay | Admitting: Orthopaedic Surgery

## 2019-11-18 ENCOUNTER — Ambulatory Visit (INDEPENDENT_AMBULATORY_CARE_PROVIDER_SITE_OTHER): Payer: Commercial Managed Care - PPO | Admitting: Orthopaedic Surgery

## 2019-11-18 DIAGNOSIS — M7989 Other specified soft tissue disorders: Secondary | ICD-10-CM | POA: Diagnosis not present

## 2019-11-18 DIAGNOSIS — M79675 Pain in left toe(s): Secondary | ICD-10-CM

## 2019-11-18 DIAGNOSIS — W5911XD Bitten by nonvenomous snake, subsequent encounter: Secondary | ICD-10-CM

## 2019-11-18 MED ORDER — METHYLPREDNISOLONE 4 MG PO TABS: ORAL_TABLET | ORAL | 0 refills | Status: AC

## 2019-11-18 NOTE — Progress Notes (Signed)
The patient is seen today as she still recovering from a slight bite to her left foot.  She did have antivenom.  She is now able to at least get a flip-flop on and the swelling has gone down significantly but on exam today it is still quite swollen in her foot and ankle.  There is no evidence of soft tissue necrosis.  Her foot is well-perfused.  She is able to now flex and extend her toes and ankle but is still quite painful and swollen.  I would like to try a 6-day steroid taper now to help with inflammatory process given that her infection risk is now minimal.  I want to keep her off her foot elevated much as possible for at least another week to 2 weeks.  I gave her a note for work reflecting that.  I would like to reevaluate her swelling and foot in just 1 week.  All question concerns were answered and addressed.

## 2019-11-25 ENCOUNTER — Encounter: Payer: Self-pay | Admitting: Orthopaedic Surgery

## 2019-11-25 ENCOUNTER — Ambulatory Visit: Payer: Commercial Managed Care - PPO | Admitting: Orthopaedic Surgery

## 2019-11-25 DIAGNOSIS — W5911XD Bitten by nonvenomous snake, subsequent encounter: Secondary | ICD-10-CM

## 2019-11-25 DIAGNOSIS — M7989 Other specified soft tissue disorders: Secondary | ICD-10-CM

## 2019-11-25 DIAGNOSIS — M79675 Pain in left toe(s): Secondary | ICD-10-CM | POA: Diagnosis not present

## 2019-11-25 MED ORDER — PROMETHAZINE HCL 12.5 MG PO TABS: 12.5000 mg | ORAL_TABLET | Freq: Four times a day (QID) | ORAL | 1 refills | Status: AC | PRN

## 2019-11-25 NOTE — Progress Notes (Signed)
The patient is now just past 3 weeks status post sustaining bite to her left foot.  This did require her to have antivenom.  She still reports left foot swelling and pain but does report that this is decreased over the last week.  I did put her on a steroid taper.  She does have complaints of headache and nausea.  On exam her left foot is still quite swollen but is significantly less than the last visit.  She is now able to flex and extend at her ankle and her toes.  There is no erythema or evidence of cellulitis or infection.  I do need to keep her out of work an additional week as she continues to recover from the snakebite.  She does take intermittent ibuprofen or Aleve for inflammation and pain.  I will send in some Phenergan for her nausea.  We will reevaluate her in 1 week in terms of determining her return to work date.  All questions concerns were answered and addressed.

## 2019-12-02 ENCOUNTER — Ambulatory Visit: Payer: Commercial Managed Care - PPO | Admitting: Orthopaedic Surgery

## 2019-12-02 ENCOUNTER — Encounter: Payer: Self-pay | Admitting: Orthopaedic Surgery

## 2019-12-02 DIAGNOSIS — M79675 Pain in left toe(s): Secondary | ICD-10-CM

## 2019-12-02 DIAGNOSIS — W5911XD Bitten by nonvenomous snake, subsequent encounter: Secondary | ICD-10-CM

## 2019-12-02 NOTE — Progress Notes (Signed)
The patient has been recovering from a snake bite by a copperhead snake to her left foot.  She had significant swelling and inflammation and we will watch her closely.  It is finally taken a turn for the better and for the first time she is in a regular shoe.  When compared to her previous visits, the swelling is much less in her left foot and ankle.  There is still swelling present but she has much improved range of motion of her foot and toes as well as her ankle.  Her pain is just mild.  She has a lot of pain proximal to the lateral malleolus as well as in the foot.  She is neurovascularly intact otherwise.  At this point will allow her to return to work starting tomorrow but for half days only for the first week.  Starting next week as he can resume full work.  She should expect continued swelling but this will hopefully subside over the next several weeks.  All questions and concerns were answered and addressed.

## 2020-10-15 HISTORY — PX: COLONOSCOPY: SHX174

## 2020-12-07 IMAGING — CT CT ANGIOGRAPHY CHEST
3 of 11 series · 17 of 46 positions shown · IV contrast (APPLIED)
Comparison: CT of the abdomen pelvis dated 11/19/2009

CLINICAL DATA: 52-year-old female with shortness of breath. Recent
tummy tuck surgery. Patient also has fever and chills and cough and
vomiting.

EXAM:
CT ANGIOGRAPHY CHEST
CT ABDOMEN AND PELVIS WITH CONTRAST
TECHNIQUE: Multidetector CT imaging of the chest was performed using the
standard protocol during bolus administration of intravenous
contrast. Multiplanar CT image reconstructions and MIPs were
obtained to evaluate the vascular anatomy. Multidetector CT imaging
of the abdomen and pelvis was performed using the standard protocol
during bolus administration of intravenous contrast.
CONTRAST:  100mL OMNIPAQUE IOHEXOL 350 MG/ML SOLN

[Series 6: thins · axial · 0.76mm/px · z∈[-402,-194]mm · 14 of 242 slices shown]
[im 17/242  lung]
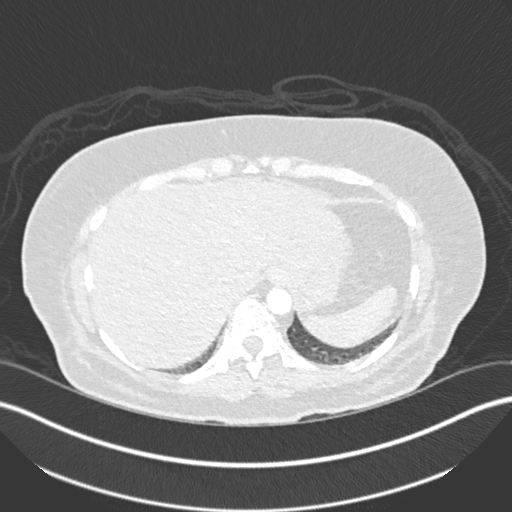
[im 33/242  soft-tissue]
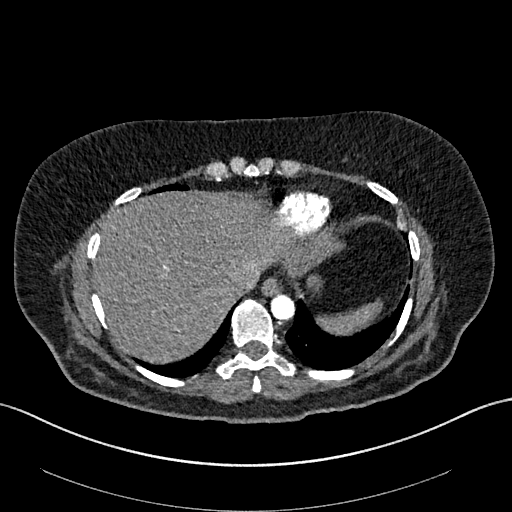
[im 49/242  lung]
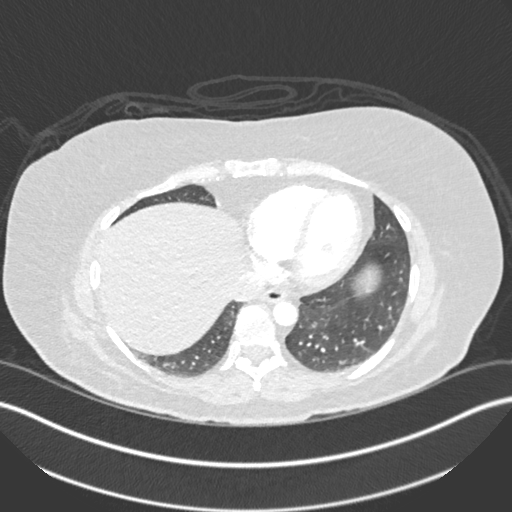
[im 65/242  soft-tissue]
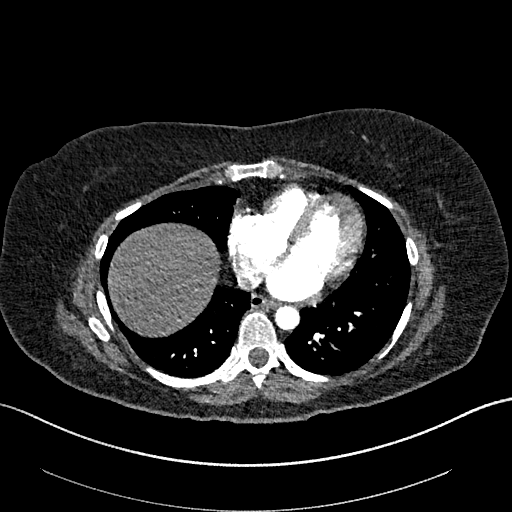
[im 81/242  lung]
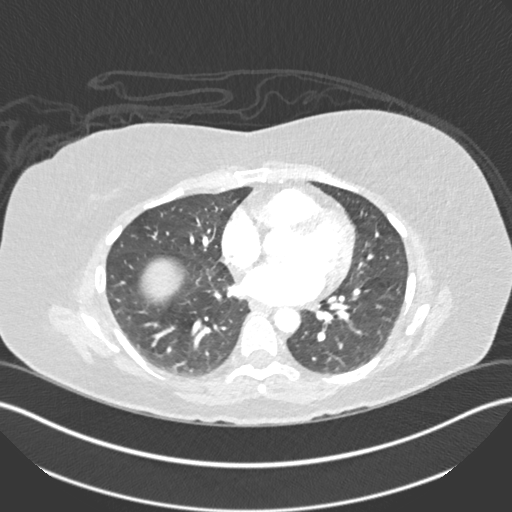
[im 97/242  soft-tissue]
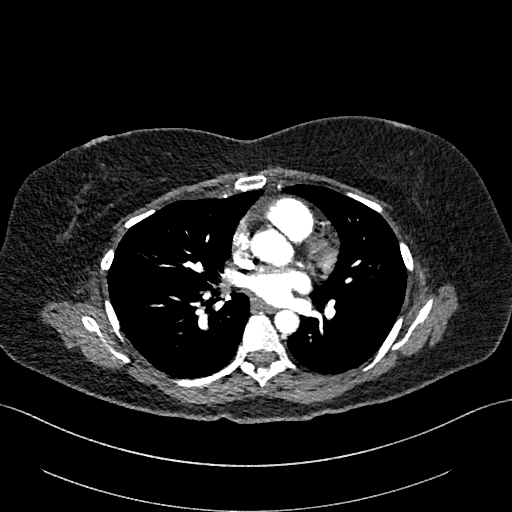
[im 113/242  lung]
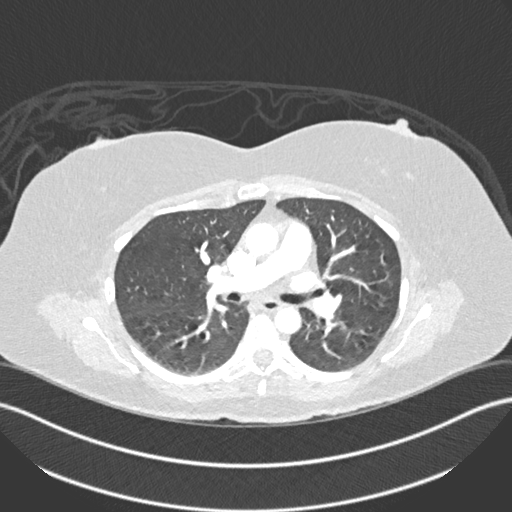
[im 129/242  soft-tissue]
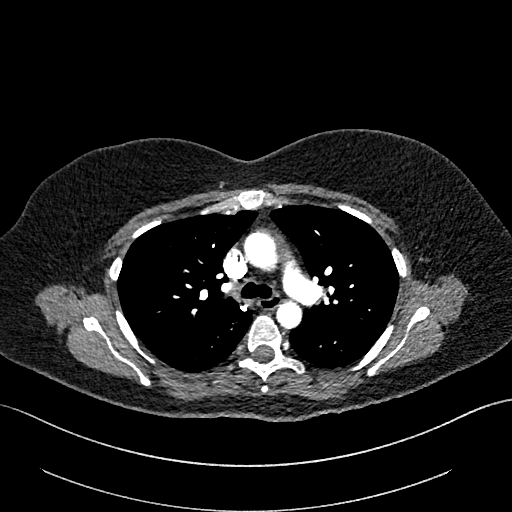
[im 145/242  lung]
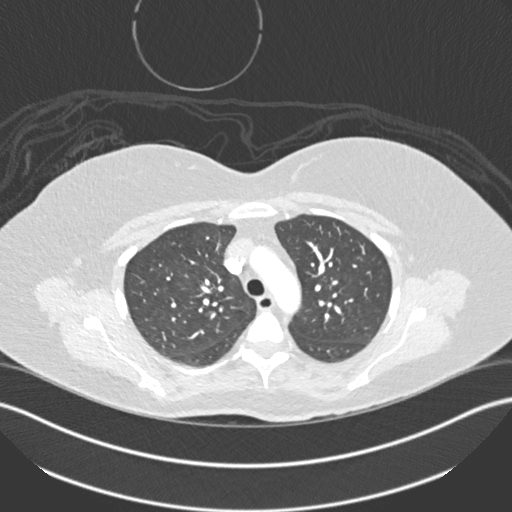
[im 161/242  soft-tissue]
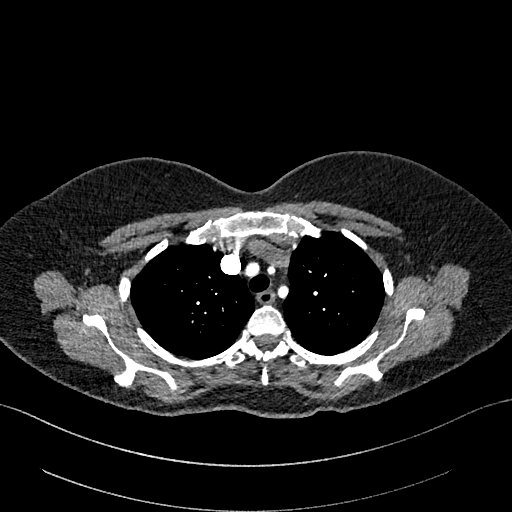
[im 177/242  lung]
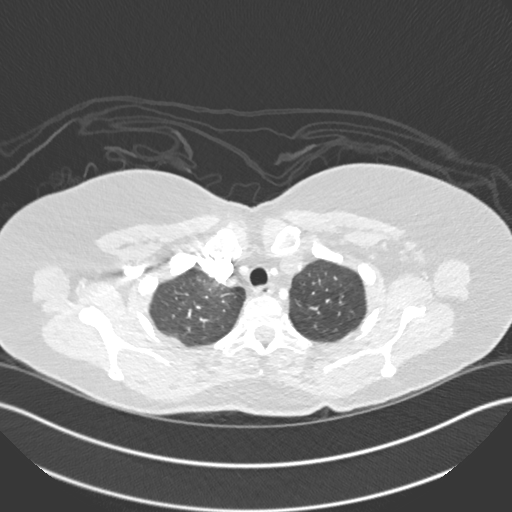
[im 193/242  soft-tissue]
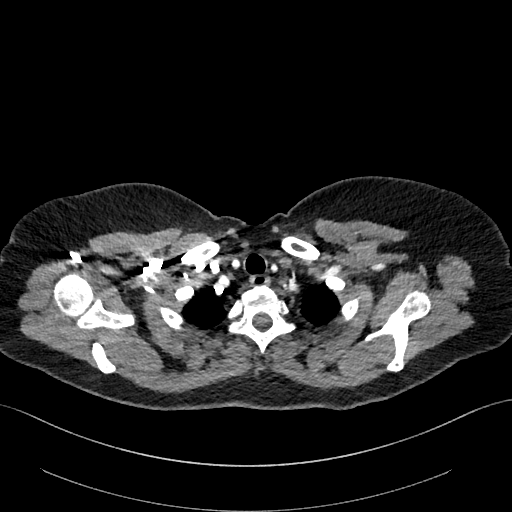
[im 209/242  lung]
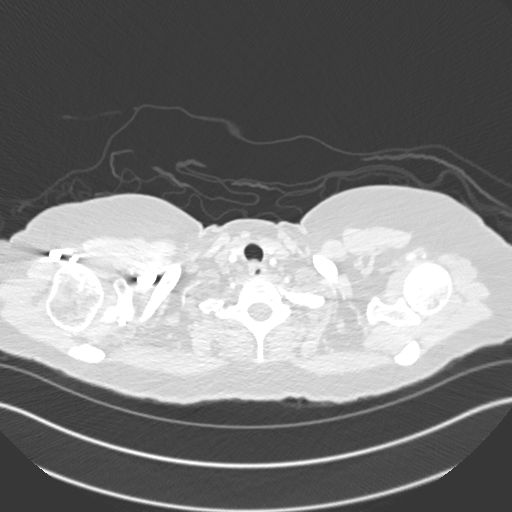
[im 225/242  soft-tissue]
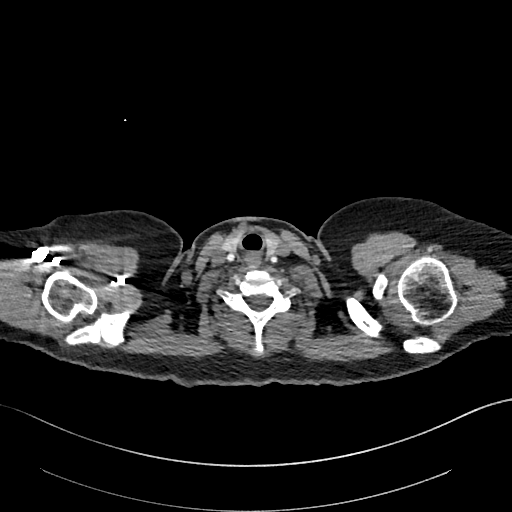

[Series 8: coronal mpr · coronal · 0.48mm/px · 1 of 80 slices shown]
[im 40/80  soft-tissue]
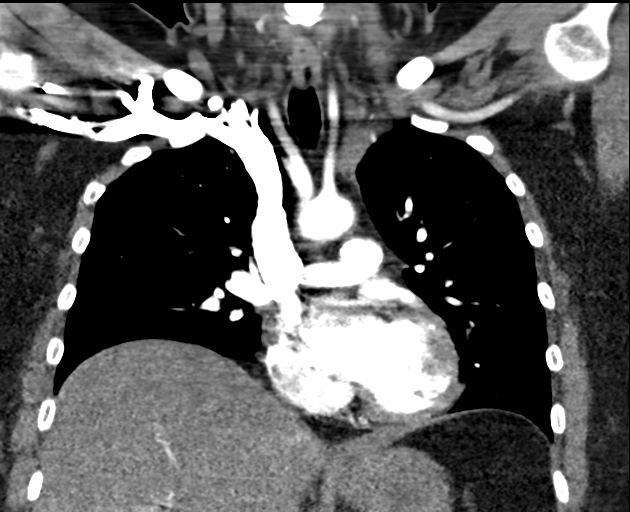

[Series 12: abd/ pelvis 5.0 i30f 1 · axial · 0.87mm/px · z∈[-719,-639]mm · 2 of 98 slices shown]
[im 17/98  lung]
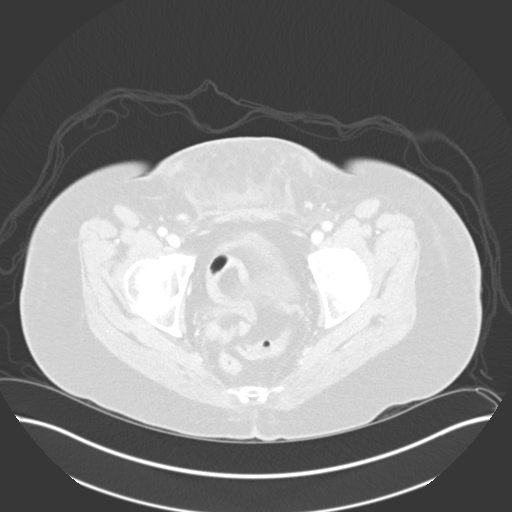
[im 33/98  lung]
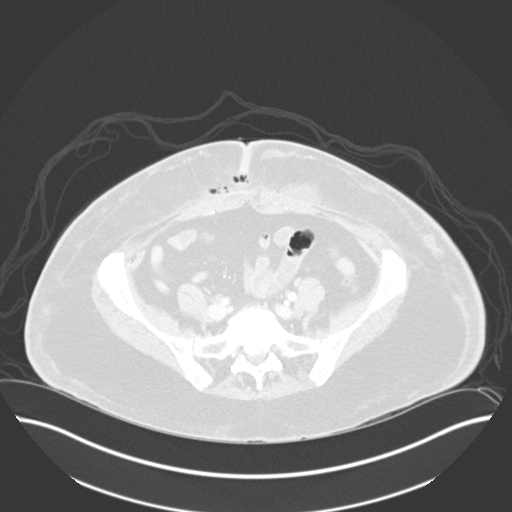

[17 of 46 positions shown; findings below may reference images not displayed]

FINDINGS: CTA CHEST FINDINGS

Cardiovascular: There is no cardiomegaly or pericardial effusion.
The thoracic aorta is unremarkable. The origins of the great vessels
of the aortic arch appear patent as visualized. There is no CT
evidence of pulmonary embolism.

Mediastinum/Nodes: No hilar or mediastinal adenopathy. The esophagus
and the thyroid gland are grossly unremarkable. No mediastinal fluid
collection.

Lungs/Pleura: The lungs are clear. There is no pleural effusion or
pneumothorax. The central airways are patent.

Musculoskeletal: No chest wall abnormality. No acute or significant
osseous findings.

Review of the MIP images confirms the above findings.

CT ABDOMEN and PELVIS FINDINGS

No intra-abdominal free air or free fluid.

Hepatobiliary: Cholecystectomy. Probable mild fatty infiltration of
the liver. No intrahepatic biliary ductal dilatation.

Pancreas: Unremarkable. No pancreatic ductal dilatation or
surrounding inflammatory changes.

Spleen: Normal in size without focal abnormality.

Adrenals/Urinary Tract: Adrenal glands are unremarkable. Kidneys are
normal, without renal calculi, focal lesion, or hydronephrosis.
Bladder is unremarkable.

Stomach/Bowel: There is no bowel obstruction or active inflammation.
Small scattered sigmoid diverticula without active inflammation. The
appendix is normal.

Vascular/Lymphatic: The abdominal aorta and IVC are unremarkable. No
portal venous gas. There is no adenopathy.

Reproductive: Hysterectomy. No pelvic mass.

Other: Diffuse subcutaneous edema of the lower abdomen and pelvis in
keeping with recent history of liposuction. There is a 10.4 x 2.2 x
7.8 cm fluid collection with small pockets of air superficial to the
rectus muscle in the subcutaneous fat extending from the level of
the umbilicus inferiorly. This may represent postsurgical fluid
collection or seroma although superimposed infection is not entirely
excluded. Clinical correlation is recommended.

Musculoskeletal: Degenerative changes of the lower lumbar spine. No
acute osseous pathology.

Review of the MIP images confirms the above findings.
IMPRESSION: 1. No acute intrathoracic pathology. No CT evidence of pulmonary
embolism or aortic dissection.
2. Postsurgical changes of liposuction. A fluid collection with
small pockets of air superficial to the rectus muscle in the
subcutaneous fat in the lower abdomen/pelvis may represent
postsurgical fluid collection or seroma or possibly an infected
fluid. Clinical correlation is recommended.

## 2022-01-04 ENCOUNTER — Telehealth: Payer: Self-pay | Admitting: Gastroenterology

## 2022-01-04 NOTE — Telephone Encounter (Signed)
Good Afternoon Dr. Chales Abrahams   I spoke with this patient and she is asking to transfer her care specifically to you and is wanting your second opinion. Patient states she has diverticulitis and is recently having trouble eating without going to the bathroom. She believes that there could be something else wrong that Dr. Jennye Boroughs may be overlooking. I have acquired all the needed records needed for you to review. Please advise on scheduling.  Thank you

## 2022-01-14 NOTE — Telephone Encounter (Signed)
Made for 03-03-2022 at 230pm coming at 215pm. Patient given address and number and understands.

## 2022-01-14 NOTE — Telephone Encounter (Signed)
Declined seeing an app at this time

## 2022-03-01 IMAGING — CR DG FOOT 2V*L*
2 series · 2 of 2 positions shown · non-contrast
Comparison: None

CLINICAL DATA: Snake bite to dorsal surface of LEFT foot on
[REDACTED], swelling increased to mid calf, pain radiating to LEFT
groin

EXAM:
LEFT FOOT - 2 VIEW

[foot ap]
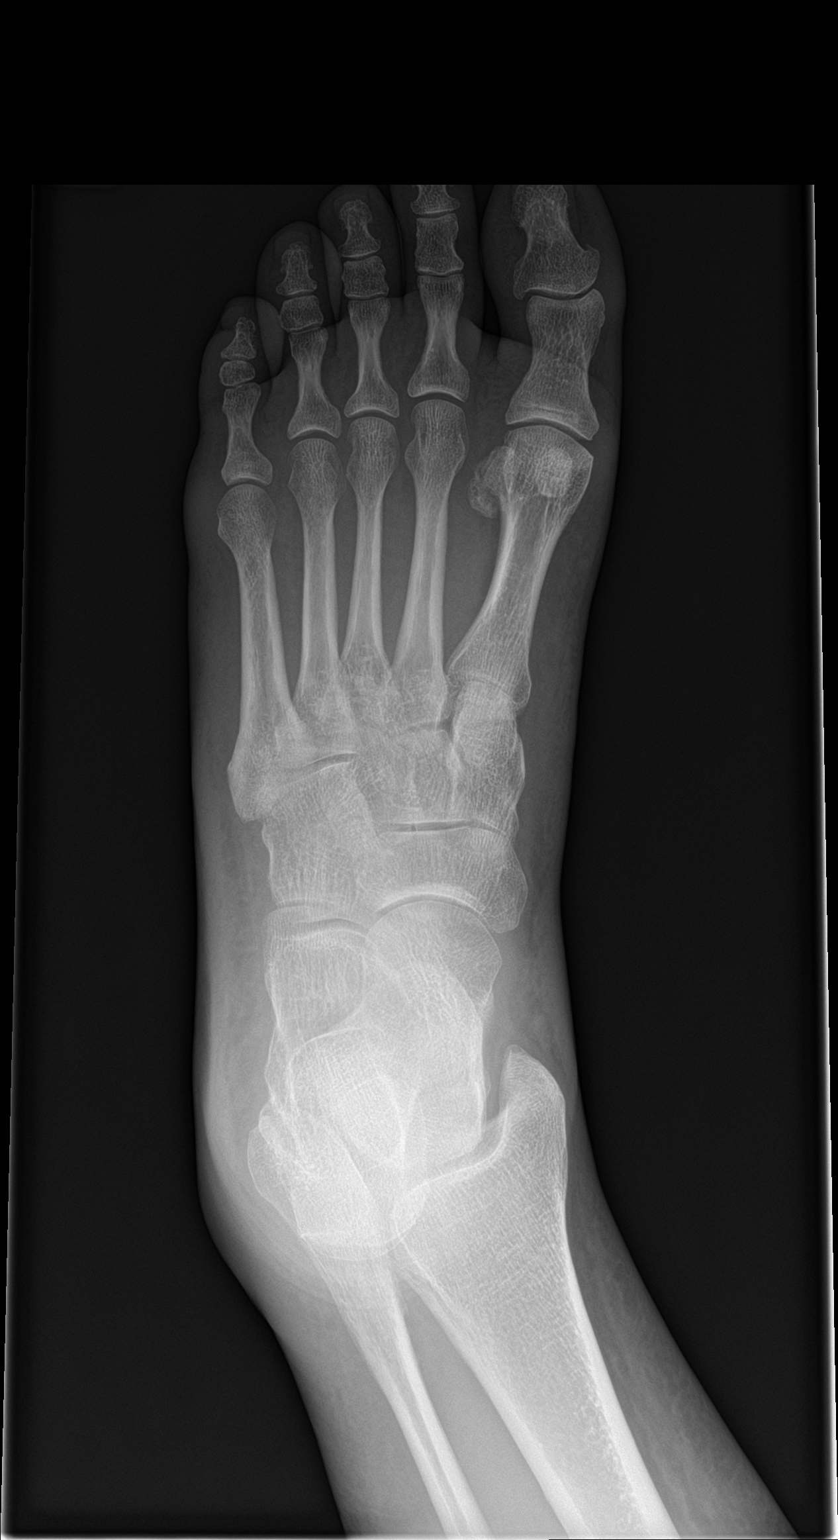

[foot lat]
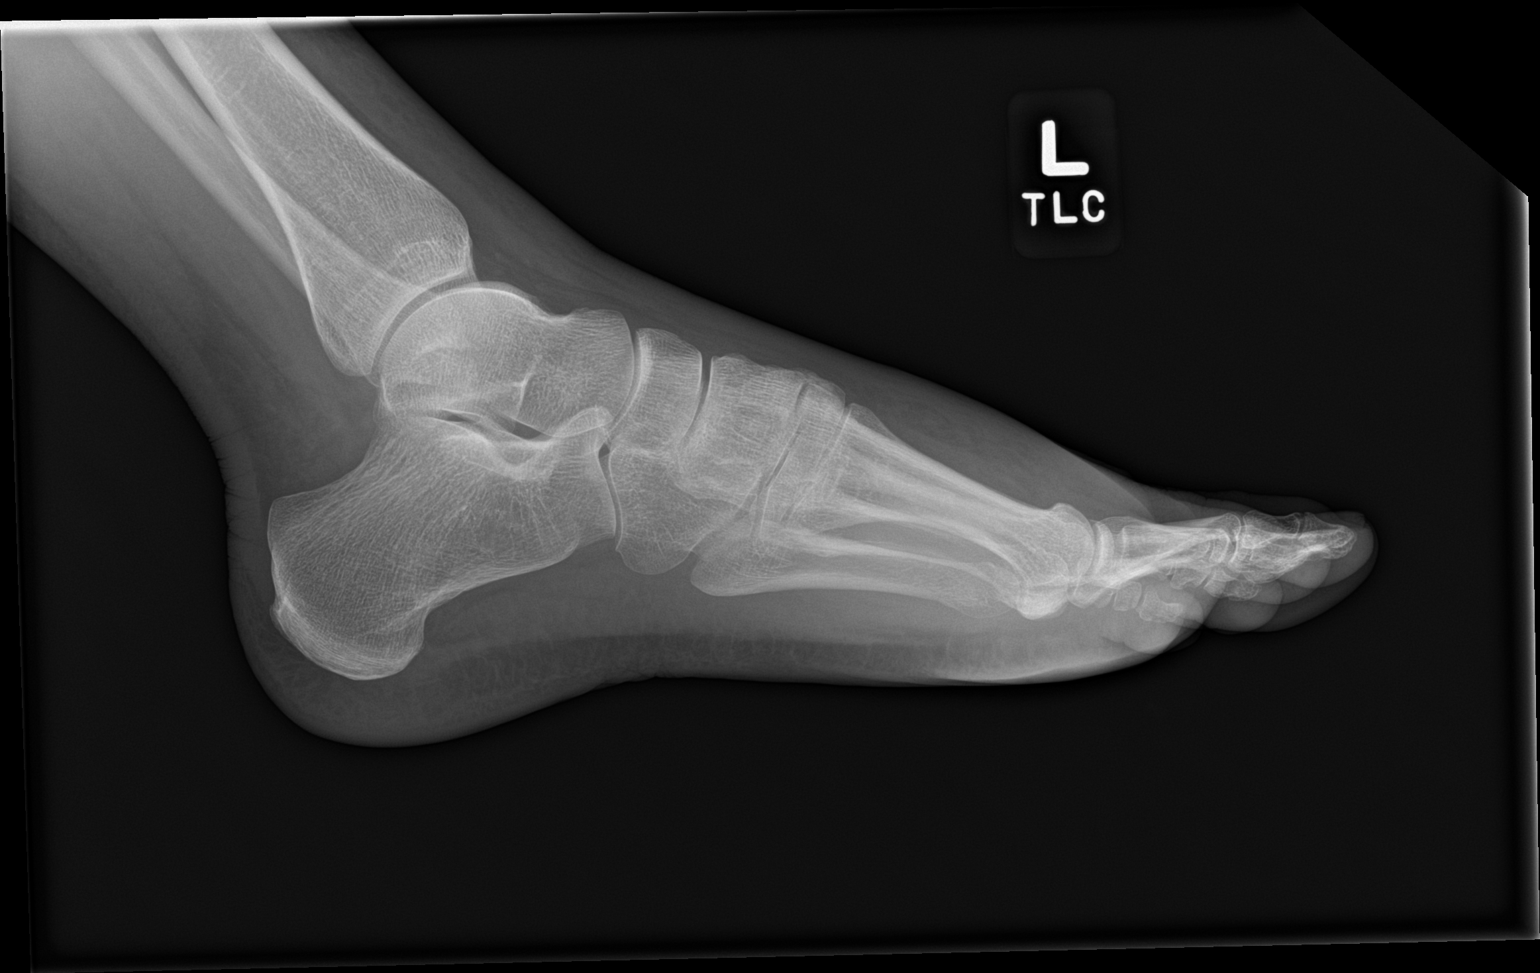

[2 of 2 positions shown; findings below may reference images not displayed]

FINDINGS: Diffuse soft tissue swelling.

Osseous mineralization normal.

Joint spaces preserved.

No fracture, dislocation, or bone destruction.
IMPRESSION: Soft tissue swelling without osseous abnormalities.

## 2022-03-03 ENCOUNTER — Other Ambulatory Visit (INDEPENDENT_AMBULATORY_CARE_PROVIDER_SITE_OTHER): Payer: Commercial Managed Care - PPO

## 2022-03-03 ENCOUNTER — Encounter: Payer: Self-pay | Admitting: Gastroenterology

## 2022-03-03 ENCOUNTER — Ambulatory Visit: Payer: Commercial Managed Care - PPO | Admitting: Gastroenterology

## 2022-03-03 VITALS — BP 132/80 | HR 77 | Ht 63.0 in | Wt 153.0 lb

## 2022-03-03 DIAGNOSIS — R1031 Right lower quadrant pain: Secondary | ICD-10-CM | POA: Diagnosis not present

## 2022-03-03 DIAGNOSIS — R197 Diarrhea, unspecified: Secondary | ICD-10-CM | POA: Diagnosis not present

## 2022-03-03 DIAGNOSIS — Z8719 Personal history of other diseases of the digestive system: Secondary | ICD-10-CM

## 2022-03-03 LAB — CBC WITH DIFFERENTIAL/PLATELET
Basophils Absolute: 0.1 10*3/uL (ref 0.0–0.1)
Basophils Relative: 0.9 % (ref 0.0–3.0)
Eosinophils Absolute: 0.1 10*3/uL (ref 0.0–0.7)
Eosinophils Relative: 1.6 % (ref 0.0–5.0)
HCT: 43.4 % (ref 36.0–46.0)
Hemoglobin: 15.2 g/dL — ABNORMAL HIGH (ref 12.0–15.0)
Lymphocytes Relative: 29.2 % (ref 12.0–46.0)
Lymphs Abs: 2.7 10*3/uL (ref 0.7–4.0)
MCHC: 34.9 g/dL (ref 30.0–36.0)
MCV: 92.2 fl (ref 78.0–100.0)
Monocytes Absolute: 0.5 10*3/uL (ref 0.1–1.0)
Monocytes Relative: 5.9 % (ref 3.0–12.0)
Neutro Abs: 5.7 10*3/uL (ref 1.4–7.7)
Neutrophils Relative %: 62.4 % (ref 43.0–77.0)
Platelets: 273 10*3/uL (ref 150.0–400.0)
RBC: 4.71 Mil/uL (ref 3.87–5.11)
RDW: 13.1 % (ref 11.5–15.5)
WBC: 9.1 10*3/uL (ref 4.0–10.5)

## 2022-03-03 LAB — COMPREHENSIVE METABOLIC PANEL
ALT: 17 U/L (ref 0–35)
AST: 18 U/L (ref 0–37)
Albumin: 4.4 g/dL (ref 3.5–5.2)
Alkaline Phosphatase: 53 U/L (ref 39–117)
BUN: 20 mg/dL (ref 6–23)
CO2: 32 mEq/L (ref 19–32)
Calcium: 9.6 mg/dL (ref 8.4–10.5)
Chloride: 100 mEq/L (ref 96–112)
Creatinine, Ser: 0.94 mg/dL (ref 0.40–1.20)
GFR: 66.94 mL/min (ref >60.00)
Glucose, Bld: 92 mg/dL (ref 70–99)
Potassium: 3.7 mEq/L (ref 3.5–5.1)
Sodium: 140 mEq/L (ref 135–145)
Total Bilirubin: 0.3 mg/dL (ref 0.2–1.2)
Total Protein: 8 g/dL (ref 6.0–8.3)

## 2022-03-03 LAB — C-REACTIVE PROTEIN: CRP: <1 mg/dL (ref 0.5–20.0)

## 2022-03-03 LAB — TSH: TSH: 1.58 u[IU]/mL (ref 0.35–5.50)

## 2022-03-03 MED ORDER — DICYCLOMINE HCL 10 MG PO CAPS: 10.0000 mg | ORAL_CAPSULE | Freq: Two times a day (BID) | ORAL | 2 refills | Status: AC

## 2022-03-03 NOTE — Progress Notes (Signed)
Chief Complaint: Abdo pain  Referring Provider:  Dr Colen Darling      ASSESSMENT AND PLAN;   #1.  Episodic RLQ pain with diarrhea  #2. H/O diverticulosis  #3. H/O Polyps. Next colon due 10/2025  Plan: -CBC, CMP, celiac, TSH, CRP. -CT AP with contrast -Trial of bentyl 10mg  po BID #60. -FU in 24 weeks.  Earlier, if with problems   HPI:    Allison Owens is a 57 y.o. female  Works in Aeronautical engineer at [employer]  With intermittent episodic RLQ/RMQ Abdo pain, with associated watery diarrhea without any nocturnal symptoms.  Some associated abdominal bloating.  Mostly these episodes last for 3 to 4 days.  However most recent episode lasted for about 2 weeks.  No melena or hematochezia  Could not identify any triggering factors. No fever or chills.  No night sweats.  Pain does radiate to the back.  She feels "normal" in between the above episodes with normal BMs.  No history of constipation.  No recent weight loss.  Denies having any upper GI symptoms including nausea, vomiting, heartburn, regurgitation, odynophagia or dysphagia associated with the above.  No sodas, chocolates, chewing gums, artificial sweeteners and candy. No NSAIDs except Excedrin when she has migraine headaches.  No history suggestive of gluten or lactose intolerance.  These episodes have been more prominent since cholecystectomy 12 years ago.    Previous GI workup (Dr. Lyda Jester): Colonoscopy 10/15/2020 -Diminutive colonic polyp s/p polypectomy -Scattered diverticula in the right and left colon. -Small internal and external hemorrhoids -Repeat colonoscopy in 5 years  Colonoscopy 03/2014: Few scattered diverticula in the colon. Colonoscopy 2007: Adenomatous polyps  FH- mom Br ca  SH: Works in [employer], divorced, 1 daughter who is head RN @ICU  at [employer]  Past Medical History:  Diagnosis Date   Hypertension     Past Surgical History:  Procedure Laterality Date   ABDOMINAL HYSTERECTOMY      BREAST REDUCTION SURGERY     CHOLECYSTECTOMY     COLONOSCOPY  10/15/2020   Dr Lyda Jester. Diminutive colon polyp (1) Scattered diverticula of the right left colon at sign 3. Small internal small external hemorrhoids.   COLONOSCOPY  03/13/2014   Dr Lyda Jester. Few scattered diverticuli of left colon    Family History  Problem Relation Age of Onset   Breast cancer Mother    Bladder Cancer Father    Kidney cancer Father    Colon cancer Neg Hx    Stomach cancer Neg Hx    Esophageal cancer Neg Hx     Social History   Tobacco Use   Smoking status: Never   Smokeless tobacco: Never  Vaping Use   Vaping Use: Never used  Substance Use Topics   Alcohol use: No   Drug use: No    Current Outpatient Medications  Medication Sig Dispense Refill   metoprolol succinate (TOPROL-XL) 100 MG 24 hr tablet Take 100 mg by mouth daily.     rizatriptan (MAXALT) 10 MG tablet Take 10 mg by mouth as directed. 10 mg at the start of migraine     valsartan (DIOVAN) 80 MG tablet Take 100 mg by mouth daily.     gabapentin (NEURONTIN) 100 MG capsule Take 1 capsule (100 mg total) by mouth 3 (three) times daily as needed. 60 capsule 0   ibuprofen (ADVIL) 800 MG tablet Take 1 tablet (800 mg total) by mouth every 8 (eight) hours as needed. 60 tablet 1   methylPREDNISolone (MEDROL) 4  MG tablet Medrol dose pack. Take as instructed 21 tablet 0   promethazine (PHENERGAN) 12.5 MG tablet Take 1 tablet (12.5 mg total) by mouth every 6 (six) hours as needed for nausea or vomiting. 10 tablet 1   propranolol ER (INDERAL LA) 160 MG SR capsule Take 1 capsule by mouth daily. (Patient not taking: Reported on 03/03/2022)     No current facility-administered medications for this visit.    Allergies  Allergen Reactions   Codeine Nausea And Vomiting   Versed [Midazolam] Nausea And Vomiting    Review of Systems:  Constitutional: Denies fever, chills, diaphoresis, appetite change and fatigue.  HEENT: Denies photophobia,  eye pain, redness, hearing loss, ear pain, congestion, sore throat, rhinorrhea, sneezing, mouth sores, neck pain, neck stiffness and tinnitus.   Respiratory: Denies SOB, DOE, cough, chest tightness,  and wheezing.   Cardiovascular: Denies chest pain, palpitations and leg swelling.  Genitourinary: Denies dysuria, urgency, frequency, hematuria, flank pain and difficulty urinating.  Musculoskeletal: Denies myalgias, back pain, joint swelling, arthralgias and gait problem.  Skin: No rash.  Neurological: Denies dizziness, seizures, syncope, weakness, light-headedness, numbness and has headaches.  Hematological: Denies adenopathy. Easy bruising, personal or family bleeding history  Psychiatric/Behavioral: No anxiety or depression.  Has sleeping problems.     Physical Exam:    BP 132/80   Pulse 77   Ht 5\' 3"  (1.6 m)   Wt 153 lb (69.4 kg)   BMI 27.10 kg/m  Wt Readings from Last 3 Encounters:  03/03/22 153 lb (69.4 kg)  11/05/19 156 lb (70.8 kg)  11/04/19 156 lb (70.8 kg)   Constitutional:  Well-developed, in no acute distress. Psychiatric: Normal mood and affect. Behavior is normal. HEENT: Pupils normal.  Conjunctivae are normal. No scleral icterus. Neck supple.  Cardiovascular: Normal rate, regular rhythm. No edema Pulmonary/chest: Effort normal and breath sounds normal. No wheezing, rales or rhonchi. Abdominal: Soft, nondistended. Nontender. Bowel sounds active throughout. There are no masses palpable. No hepatomegaly. Rectal: Deferred Neurological: Alert and oriented to person place and time. Skin: Skin is warm and dry. No rashes noted.  Data Reviewed: I have personally reviewed following labs and imaging studies  CBC:    Latest Ref Rng & Units 11/05/2019    9:56 AM 08/13/2018   12:12 PM  CBC  WBC 4.0 - 10.5 K/uL 6.9  10.0   Hemoglobin 12.0 - 15.0 g/dL 10/14/2018  18.8   Hematocrit 36.0 - 46.0 % 40.8  33.8   Platelets 150 - 400 K/uL 196  288     CMP:    Latest Ref Rng & Units  11/05/2019    9:56 AM 08/13/2018   12:12 PM  CMP  Glucose 70 - 99 mg/dL 10/14/2018  606   BUN 6 - 20 mg/dL 13  10   Creatinine 301 - 1.00 mg/dL 6.01  0.93   Sodium 2.35 - 145 mmol/L 138  135   Potassium 3.5 - 5.1 mmol/L 3.5  3.9   Chloride 98 - 111 mmol/L 104  103   CO2 22 - 32 mmol/L 25  24   Calcium 8.9 - 10.3 mg/dL 8.9  8.5   Total Protein 6.5 - 8.1 g/dL 6.9  6.4   Total Bilirubin 0.3 - 1.2 mg/dL 1.0  1.0   Alkaline Phos 38 - 126 U/L 54  43   AST 15 - 41 U/L 33  22   ALT 0 - 44 U/L 26  12        Raj  Lyndel Safe, MD 03/03/2022, 2:52 PM  Cc: Dr Sarina Ser

## 2022-03-03 NOTE — Patient Instructions (Signed)
_______________________________________________________  If your blood pressure at your visit was 140/90 or greater, please contact your primary care physician to follow up on this.  _______________________________________________________  If you are age 59 or older, your body mass index should be between 23-30. Your Body mass index is 27.1 kg/m. If this is out of the aforementioned range listed, please consider follow up with your Primary Care Provider.  If you are age 21 or younger, your body mass index should be between 19-25. Your Body mass index is 27.1 kg/m. If this is out of the aformentioned range listed, please consider follow up with your Primary Care Provider.   ________________________________________________________  The [facility] providers would like to encourage you to use [facility] to communicate with providers for non-urgent requests or questions.  Due to long hold times on the telephone, sending your provider a message by [facility] may be a faster and more efficient way to get a response.  Please allow 48 business hours for a response.  Please remember that this is for non-urgent requests.  _______________________________________________________  Your provider has requested that you go to the basement level for lab work before leaving today. Press "B" on the elevator. The lab is located at the first door on the left as you exit the elevator.  Please follow up in 6 months. Give [place] a call at [phone] to schedule an appointment.  We have sent the following medications to your pharmacy for you to pick up at your convenience: Bentyl  You have been scheduled [facility] CT [facility] [place], [place] [address]   You are scheduled on 03-10-2022 at 430pm. You should arrive at 2:15pm prior to your appointment time for registration. Please follow the written instructions below on the day of your exam:  WARNING: IF YOU ARE  ALLERGIC TO IODINE/X-RAY DYE, PLEASE NOTIFY RADIOLOGY IMMEDIATELY AT [phone]! YOU WILL BE GIVEN A 13 HOUR PREMEDICATION PREP.  1) Do not eat or drink anything after 1230pm (4 hours prior to your test) 2) You will be given 2 bottles of oral contrast to drink on site. The solution may taste better if refrigerated, but do NOT add ice or any other liquid to this solution. Shake well before drinking.    Drink 1 bottle of contrast @ 230pm (2 hours prior to your exam)  Drink 1 bottle of contrast @ 330pm (1 hour prior to your exam)  You may take any medications as prescribed with a small amount of water, if necessary. If you take any of the following medications: METFORMIN, GLUCOPHAGE, GLUCOVANCE, AVANDAMET, RIOMET, FORTAMET, [employer] MET, JANUMET, GLUMETZA or METAGLIP, you MAY be asked to HOLD this medication 48 hours AFTER the exam.  The purpose of you drinking the oral contrast is to aid in the visualization of your intestinal tract. The contrast solution may cause some diarrhea. Depending on your individual set of symptoms, you may also receive an intravenous injection of x-ray contrast/dye. Plan on being at [facility] for 30 minutes or longer, depending on the type of exam you are having performed.  This test typically takes 30-45 minutes to complete.  If you have any questions regarding your exam or if you need to reschedule, you may call the CT department at [phone] between the hours of 8:00 am and 5:00 pm, Monday-Friday.  ________________________________________________________________________   Thank you,  Dr. Jackquline Denmark

## 2022-03-07 LAB — CELIAC PANEL 10
Antigliadin Abs, IgA: 10 units (ref 0–19)
Endomysial IgA: NEGATIVE
Gliadin IgG: 4 units (ref 0–19)
IgA/Immunoglobulin A, Serum: 336 mg/dL (ref 87–352)
Tissue Transglut Ab: 5 U/mL (ref 0–5)
Transglutaminase IgA: <2 U/mL (ref 0–3)

## 2022-03-09 ENCOUNTER — Telehealth: Payer: Self-pay | Admitting: Gastroenterology

## 2022-03-09 NOTE — Telephone Encounter (Signed)
Per April no auth needed. Faxed CT order and CMP to [facility] at [phone] and called patient and told her that she can schedule it now and she said she will call them and I told her id he called today just let them I know that I faxed it today and that blood work is good for about 60 days with them

## 2022-03-09 NOTE — Telephone Encounter (Signed)
Inbound call from patient, states she works at [employer]. Patient has CT at [facility] but the cost for the CT would be over $1,000. Patient is requesting to get CT order sent to [facility] where it would be more affordable. Please advise.

## 2022-03-09 NOTE — Telephone Encounter (Signed)
Told her that she can get it from [facility] but it needs to be approved first and to give me a week to work on it because it needs an Information systems manager and she understood. Would be 1400 at [facility] and 100 at [facility] per patient

## 2022-03-10 ENCOUNTER — Ambulatory Visit (HOSPITAL_COMMUNITY): Payer: Commercial Managed Care - PPO

## 2022-03-10 ENCOUNTER — Encounter (HOSPITAL_COMMUNITY): Payer: Self-pay

## 2022-03-16 NOTE — Telephone Encounter (Signed)
Patient made aware that no acute abnormalities. Increased fatty liver but no major changes.

## 2022-03-16 NOTE — Telephone Encounter (Signed)
Faxed to PCP successfully
